# Patient Record
Sex: Male | Born: 1995 | Race: Black or African American | Hispanic: No | Marital: Single
Health system: Southern US, Community
[De-identification: ages and names within clinical notes are randomized; demographics above are authoritative.]

---
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
## 2019-07-03 ENCOUNTER — Other Ambulatory Visit: Payer: Self-pay

## 2019-07-03 DIAGNOSIS — Z20822 Contact with and (suspected) exposure to covid-19: Secondary | ICD-10-CM

## 2019-07-04 LAB — NOVEL CORONAVIRUS, NAA: SARS-CoV-2, NAA: DETECTED — AB

## 2019-07-05 DIAGNOSIS — U071 COVID-19: Secondary | ICD-10-CM | POA: Diagnosis not present

## 2019-11-30 ENCOUNTER — Ambulatory Visit: Payer: BC Managed Care – PPO | Attending: Internal Medicine

## 2019-11-30 DIAGNOSIS — Z23 Encounter for immunization: Secondary | ICD-10-CM

## 2019-11-30 NOTE — Progress Notes (Signed)
   Covid-19 Vaccination Clinic  Name:  Dez Stauffer    MRN: [id number] DOB: [birth year]  11/30/2019  Mr. Mowrey was observed post Covid-19 immunization for 15 minutes without incident. He was provided with Vaccine Information Sheet and instruction to access the V-Safe system.   Mr. Mogan was instructed to call 911 with any severe reactions post vaccine: Marland Kitchen Difficulty breathing  . Swelling of face and throat  . A fast heartbeat  . A bad rash all over body  . Dizziness and weakness   Immunizations Administered    Name Date Dose VIS Date Route   Pfizer COVID-19 Vaccine 11/30/2019  2:32 PM 0.3 mL 08/23/2019 Intramuscular   Manufacturer: [employer], [employer]   Lot: [id number]   NDC: [address]-2

## 2019-12-25 ENCOUNTER — Ambulatory Visit: Payer: BC Managed Care – PPO | Attending: Internal Medicine

## 2019-12-25 DIAGNOSIS — Z23 Encounter for immunization: Secondary | ICD-10-CM

## 2019-12-25 NOTE — Progress Notes (Signed)
   Covid-19 Vaccination Clinic  Name:  Colton Bryant    MRN: [id number] DOB: 1995-10-11  12/25/2019  Mr. [name] was observed post Covid-19 immunization for 15 minutes without incident. He was provided with Vaccine Information Sheet and instruction to access the V-Safe system.   Mr. [name] was instructed to call 911 with any severe reactions post vaccine: Marland Kitchen Difficulty breathing  . Swelling of face and throat  . A fast heartbeat  . A bad rash all over body  . Dizziness and weakness   Immunizations Administered    Name Date Dose VIS Date Route   Pfizer COVID-19 Vaccine 12/25/2019 11:46 AM 0.3 mL 08/23/2019 Intramuscular   Manufacturer: [employer], [employer]   Lot: [id number]   NDC: [address]-9

## 2020-06-23 DIAGNOSIS — Z6838 Body mass index (BMI) 38.0-38.9, adult: Secondary | ICD-10-CM | POA: Diagnosis not present

## 2020-06-23 DIAGNOSIS — Z1331 Encounter for screening for depression: Secondary | ICD-10-CM | POA: Diagnosis not present

## 2020-06-23 DIAGNOSIS — Z0000 Encounter for general adult medical examination without abnormal findings: Secondary | ICD-10-CM | POA: Diagnosis not present

## 2020-12-30 DIAGNOSIS — S76012A Strain of muscle, fascia and tendon of left hip, initial encounter: Secondary | ICD-10-CM | POA: Diagnosis not present

## 2020-12-30 DIAGNOSIS — E6609 Other obesity due to excess calories: Secondary | ICD-10-CM | POA: Diagnosis not present

## 2020-12-30 DIAGNOSIS — Z6836 Body mass index (BMI) 36.0-36.9, adult: Secondary | ICD-10-CM | POA: Diagnosis not present

## 2020-12-30 DIAGNOSIS — S76011A Strain of muscle, fascia and tendon of right hip, initial encounter: Secondary | ICD-10-CM | POA: Diagnosis not present

## 2020-12-30 DIAGNOSIS — Z1331 Encounter for screening for depression: Secondary | ICD-10-CM | POA: Diagnosis not present

## 2021-01-26 ENCOUNTER — Other Ambulatory Visit: Payer: Self-pay

## 2021-01-26 ENCOUNTER — Emergency Department: Payer: BC Managed Care – PPO

## 2021-01-26 ENCOUNTER — Emergency Department
Admission: EM | Admit: 2021-01-26 | Discharge: 2021-01-26 | Disposition: A | Discharge status: Home / Self Care | Payer: BC Managed Care – PPO | Attending: Emergency Medicine | Admitting: Emergency Medicine

## 2021-01-26 DIAGNOSIS — R200 Anesthesia of skin: Secondary | ICD-10-CM | POA: Insufficient documentation

## 2021-01-26 DIAGNOSIS — R079 Chest pain, unspecified: Secondary | ICD-10-CM

## 2021-01-26 DIAGNOSIS — I10 Essential (primary) hypertension: Secondary | ICD-10-CM | POA: Diagnosis not present

## 2021-01-26 LAB — COMPREHENSIVE METABOLIC PANEL
ALT: 21 U/L (ref 0–44)
AST: 25 U/L (ref 15–41)
Albumin: 4.3 g/dL (ref 3.5–5.0)
Alkaline Phosphatase: 40 U/L (ref 38–126)
Anion gap: 7 (ref 5–15)
BUN: 18 mg/dL (ref 6–20)
CO2: 24 mmol/L (ref 22–32)
Calcium: 9.1 mg/dL (ref 8.9–10.3)
Chloride: 105 mmol/L (ref 98–111)
Creatinine, Ser: 1 mg/dL (ref 0.61–1.24)
GFR, Estimated: >60 mL/min (ref >60)
Glucose, Bld: 95 mg/dL (ref 70–99)
Potassium: 4 mmol/L (ref 3.5–5.1)
Sodium: 136 mmol/L (ref 135–145)
Total Bilirubin: 0.5 mg/dL (ref 0.3–1.2)
Total Protein: 7.2 g/dL (ref 6.5–8.1)

## 2021-01-26 LAB — CBC WITH DIFFERENTIAL/PLATELET
Abs Immature Granulocytes: 0.01 10*3/uL (ref 0.00–0.07)
Basophils Absolute: 0.1 10*3/uL (ref 0.0–0.1)
Basophils Relative: 1 %
Eosinophils Absolute: 0.2 10*3/uL (ref 0.0–0.5)
Eosinophils Relative: 3 %
HCT: 43.2 % (ref 39.0–52.0)
Hemoglobin: 14.9 g/dL (ref 13.0–17.0)
Immature Granulocytes: 0 %
Lymphocytes Relative: 56 %
Lymphs Abs: 4 10*3/uL (ref 0.7–4.0)
MCH: 29.9 pg (ref 26.0–34.0)
MCHC: 34.5 g/dL (ref 30.0–36.0)
MCV: 86.6 fL (ref 80.0–100.0)
Monocytes Absolute: 0.5 10*3/uL (ref 0.1–1.0)
Monocytes Relative: 7 %
Neutro Abs: 2.3 10*3/uL (ref 1.7–7.7)
Neutrophils Relative %: 33 %
Platelets: 183 10*3/uL (ref 150–400)
RBC: 4.99 MIL/uL (ref 4.22–5.81)
RDW: 13.2 % (ref 11.5–15.5)
WBC: 7 10*3/uL (ref 4.0–10.5)
nRBC: 0 % (ref 0.0–0.2)

## 2021-01-26 LAB — TROPONIN I (HIGH SENSITIVITY)
Troponin I (High Sensitivity): 3 ng/L (ref <18)
Troponin I (High Sensitivity): 3 ng/L (ref <18)

## 2021-01-26 LAB — LIPASE, BLOOD: Lipase: 43 U/L (ref 11–51)

## 2021-01-26 MED ORDER — FAMOTIDINE IN NACL 20-0.9 MG/50ML-% IV SOLN
20.0000 mg | Freq: Once | INTRAVENOUS | Status: AC
  Administered 2021-01-26: 20 mg via INTRAVENOUS
  Filled 2021-01-26: qty 50

## 2021-01-26 MED ORDER — KETOROLAC TROMETHAMINE 30 MG/ML IJ SOLN
15.0000 mg | Freq: Once | INTRAMUSCULAR | Status: AC
  Administered 2021-01-26: 15 mg via INTRAVENOUS
  Filled 2021-01-26: qty 1

## 2021-01-26 NOTE — ED Triage Notes (Signed)
Pt in with co chest pain and right arm "swelling and numbness" that started today.

## 2021-01-26 NOTE — Discharge Instructions (Signed)
Record your blood pressure readings morning, noon and night.  Take these to your doctor when you follow-up.  Return to the ER for worsening symptoms, persistent vomiting, difficulty breathing or other concerns.

## 2021-01-26 NOTE — ED Provider Notes (Signed)
[facility] Emergency Department Provider Note   ____________________________________________   Event Date/Time   First MD Initiated Contact with Patient 01/26/21 [phone]     (approximate)  I have reviewed the triage vital signs and the nursing notes.   HISTORY  Chief Complaint Chest Pain and Numbness    HPI Colton Bryant is a 25 y.o. male who presents to the ED from home with a chief complaint of chest pain.  Patient reports chest tightness starting approximately 1 AM while trying to sleep.  Denies associated diaphoresis, shortness of breath, palpitations, nausea/vomiting or dizziness.  Denies right arm swelling to me; states feels a little numb (pointing to forearm).  Denies fever, neck pain, cough, abdominal pain, dysuria or diarrhea.  Denies recent long travel, trauma or hormone use.  Admits to recent marijuana use.     Past medical history None  There are no problems to display for this patient.   Prior to Admission medications   Not on File    Allergies Patient has no allergy information on record.  No family history on file.  Social History    Review of Systems  Constitutional: No fever/chills Eyes: No visual changes. ENT: No sore throat. Cardiovascular: Positive for chest pain. Respiratory: Denies shortness of breath. Gastrointestinal: No abdominal pain.  No nausea, no vomiting.  No diarrhea.  No constipation. Genitourinary: Negative for dysuria. Musculoskeletal: Negative for back pain. Skin: Negative for rash. Neurological: Negative for headaches, focal weakness or numbness.   ____________________________________________   PHYSICAL EXAM:  VITAL SIGNS: ED Triage Vitals  Enc Vitals Group     BP 01/26/21 0226 (!) 165/107     Pulse Rate 01/26/21 0226 68     Resp 01/26/21 0226 20     Temp 01/26/21 0226 97.9 F (36.6 C)     Temp Source 01/26/21 0226 Oral     SpO2 01/26/21 0226 100 %     Weight 01/26/21 0225 246 lb  (111.6 kg)     Height 01/26/21 0225 5\' 7"  (1.702 m)     Head Circumference --      Peak Flow --      Pain Score 01/26/21 0225 2     Pain Loc --      Pain Edu? --      Excl. in [place]? --     Constitutional: Alert and oriented. Well appearing and in no acute distress. Eyes: Conjunctivae are normal. PERRL. EOMI. Head: Atraumatic. Nose: No congestion/rhinnorhea. Mouth/Throat: Mucous membranes are moist.   Neck: No stridor.  No cervical spine tenderness to palpation.  No thyromegaly. Cardiovascular: Normal rate, regular rhythm. Grossly normal heart sounds.  Good peripheral circulation. Respiratory: Normal respiratory effort.  No retractions. Lungs CTAB. Gastrointestinal: Soft and nontender to light or deep palpation. No distention. No abdominal bruits. No CVA tenderness. Musculoskeletal: No lower extremity tenderness nor edema.  No joint effusions. Neurologic:  Normal speech and language. No gross focal neurologic deficits are appreciated.  5/5 motor strength and sensation all extremities.  No gait instability. Skin:  Skin is warm, dry and intact. No rash noted. Psychiatric: Mood and affect are mildly anxious. Speech and behavior are normal.  ____________________________________________   LABS (all labs ordered are listed, but only abnormal results are displayed)  Labs Reviewed  CBC WITH DIFFERENTIAL/PLATELET  COMPREHENSIVE METABOLIC PANEL  LIPASE, BLOOD  TROPONIN I (HIGH SENSITIVITY)  TROPONIN I (HIGH SENSITIVITY)   ____________________________________________  EKG  ED ECG REPORT I, Layani Foronda J, the attending physician, personally  viewed and interpreted this ECG.   Date: 01/26/2021  EKG Time: 0231  Rate: 65  Rhythm: normal EKG, normal sinus rhythm  Axis: Normal  Intervals:none  ST&T Change: Nonspecific  ____________________________________________  RADIOLOGY I, Felisia Balcom J, personally viewed and evaluated these images (plain radiographs) as part of my medical decision  making, as well as reviewing the written report by the radiologist.  ED MD interpretation: No acute cardiopulmonary process  Official radiology report(s): DG Chest 2 View  Result Date: 01/26/2021 CLINICAL DATA:  Chest pain EXAM: CHEST - 2 VIEW COMPARISON:  None. FINDINGS: The heart size and mediastinal contours are within normal limits. Both lungs are clear. The visualized skeletal structures are unremarkable. IMPRESSION: No active cardiopulmonary disease. Electronically Signed   By: Deatra Robinson M.D.   On: 01/26/2021 03:33    ____________________________________________   PROCEDURES  Procedure(s) performed (including Critical Care):  .1-3 Lead EKG Interpretation Performed by: Irean Hong, MD Authorized by: Irean Hong, MD     Interpretation: normal     ECG rate:  68   ECG rate assessment: normal     Rhythm: sinus rhythm     Ectopy: none     Conduction: normal   Comments:     Patient placed on cardiac monitor to evaluate for arrhythmias     ____________________________________________   INITIAL IMPRESSION / ASSESSMENT AND PLAN / ED COURSE  As part of my medical decision making, I reviewed the following data within the electronic MEDICAL RECORD NUMBER Nursing notes reviewed and incorporated, Labs reviewed, EKG interpreted, Old chart reviewed, Radiograph reviewed and Notes from prior ED visits     25 year old male presenting with chest tightness. Differential diagnosis includes, but is not limited to, ACS, aortic dissection, pulmonary embolism, cardiac tamponade, pneumothorax, pneumonia, pericarditis, myocarditis, GI-related causes including esophagitis/gastritis, and musculoskeletal chest wall pain.    Will obtain cardiac panel, chest x-ray.  Patient reported upper abdominal "hunger pains" prior to arrival, will add LFTs/lipase.  Administer IV Pepcid and Toradol.  Will reassess.  Clinical Course as of 01/26/21 3419  Tue Jan 26, 2021  3790 Feeling better.  IV Pepcid  infusing.  Updated patient and father of all test results thus far; will repeat troponin.  Although patient is mildly hypertensive, I have low suspicion for aortic dissection; chest pain has completely resolved. [JS]  [id number] Updated patient and father of repeat negative troponin.  Patient voices no complaints of chest pain or right arm numbness.  Advised him to keep a log of his blood pressures and take him to his doctor.  Strict return precautions given.  Patient verbalizes understanding agrees with plan of care. [JS]    Clinical Course User Index [JS] Irean Hong, MD     ____________________________________________   FINAL CLINICAL IMPRESSION(S) / ED DIAGNOSES  Final diagnoses:  Chest pain, unspecified type  Hypertension, unspecified type     ED Discharge Orders    None      *Please note:  Betty Brooks was evaluated in Emergency Department on 01/26/2021 for the symptoms described in the history of present illness. He was evaluated in the context of the global COVID-19 pandemic, which necessitated consideration that the patient might be at risk for infection with the SARS-CoV-2 virus that causes COVID-19. Institutional protocols and algorithms that pertain to the evaluation of patients at risk for COVID-19 are in a state of rapid change based on information released by regulatory bodies including the CDC and federal and state organizations. These policies and  algorithms were followed during the patient's care in the ED.  Some ED evaluations and interventions may be delayed as a result of limited staffing during and the pandemic.*   Note:  This document was prepared using Dragon voice recognition software and may include unintentional dictation errors.   Irean Hong, MD 01/26/21 [phone]

## 2021-02-08 ENCOUNTER — Emergency Department
Admission: EM | Admit: 2021-02-08 | Discharge: 2021-02-08 | Disposition: A | Discharge status: Home / Self Care | Payer: BC Managed Care – PPO | Attending: Emergency Medicine | Admitting: Emergency Medicine

## 2021-02-08 ENCOUNTER — Encounter: Payer: Self-pay | Admitting: Emergency Medicine

## 2021-02-08 DIAGNOSIS — R112 Nausea with vomiting, unspecified: Secondary | ICD-10-CM

## 2021-02-08 DIAGNOSIS — R55 Syncope and collapse: Secondary | ICD-10-CM | POA: Diagnosis not present

## 2021-02-08 LAB — CBC WITH DIFFERENTIAL/PLATELET
Abs Immature Granulocytes: 0.01 10*3/uL (ref 0.00–0.07)
Basophils Absolute: 0 10*3/uL (ref 0.0–0.1)
Basophils Relative: 1 %
Eosinophils Absolute: 0 10*3/uL (ref 0.0–0.5)
Eosinophils Relative: 1 %
HCT: 42.1 % (ref 39.0–52.0)
Hemoglobin: 14.3 g/dL (ref 13.0–17.0)
Immature Granulocytes: 0 %
Lymphocytes Relative: 40 %
Lymphs Abs: 2 10*3/uL (ref 0.7–4.0)
MCH: 29.4 pg (ref 26.0–34.0)
MCHC: 34 g/dL (ref 30.0–36.0)
MCV: 86.4 fL (ref 80.0–100.0)
Monocytes Absolute: 0.3 10*3/uL (ref 0.1–1.0)
Monocytes Relative: 6 %
Neutro Abs: 2.6 10*3/uL (ref 1.7–7.7)
Neutrophils Relative %: 52 %
Platelets: 197 10*3/uL (ref 150–400)
RBC: 4.87 MIL/uL (ref 4.22–5.81)
RDW: 13 % (ref 11.5–15.5)
WBC: 4.9 10*3/uL (ref 4.0–10.5)
nRBC: 0 % (ref 0.0–0.2)

## 2021-02-08 LAB — BASIC METABOLIC PANEL
Anion gap: 10 (ref 5–15)
BUN: 10 mg/dL (ref 6–20)
CO2: 22 mmol/L (ref 22–32)
Calcium: 9.6 mg/dL (ref 8.9–10.3)
Chloride: 105 mmol/L (ref 98–111)
Creatinine, Ser: 1.08 mg/dL (ref 0.61–1.24)
GFR, Estimated: >60 mL/min (ref >60)
Glucose, Bld: 99 mg/dL (ref 70–99)
Potassium: 3.7 mmol/L (ref 3.5–5.1)
Sodium: 137 mmol/L (ref 135–145)

## 2021-02-08 LAB — TROPONIN I (HIGH SENSITIVITY): Troponin I (High Sensitivity): 3 ng/L (ref <18)

## 2021-02-08 MED ORDER — SODIUM CHLORIDE 0.9 % IV BOLUS
1000.0000 mL | Freq: Once | INTRAVENOUS | Status: AC
  Administered 2021-02-08: 1000 mL via INTRAVENOUS

## 2021-02-08 NOTE — ED Triage Notes (Signed)
Pt BIB EMS for syncope. PT was a work and had sudden onset of thirst and nausea. Pt threw up and walked to bathroom and passed out.  Pt previously on steroid and muscle relaxer.  Pt highly agitated on scene. Cooperative in triage.

## 2021-02-08 NOTE — ED Notes (Signed)
Pt has been provided with discharge instructions. Pt denies any questions or concerns at this time. Pt verbalizes understanding for follow up care and d/c.  VSS.  Pt left department with all belongings.  

## 2021-02-08 NOTE — ED Provider Notes (Signed)
[facility] Emergency Department Provider Note   ____________________________________________   Event Date/Time   First MD Initiated Contact with Patient 02/08/21 1756     (approximate)  I have reviewed the triage vital signs and the nursing notes.   HISTORY  Chief Complaint Loss of Consciousness    HPI Colton Bryant is a 25 y.o. male with no significant past medical history who presents to the ED for syncope.  Patient states that he was working the Ambulance person at [employer] when he suddenly started to feel nauseous.  He went to the bathroom and vomited multiple times, attempted to make it out of the bathroom and then collapsed to the ground.  He states he does not remember what happened until bystanders were stopping him from getting up off the ground.  There was no reported seizure-like activity.  Patient was reportedly agitated on the scene but is now calm and cooperative.  He denies any associated chest pain or shortness of breath.  He has not had any fevers, cough, abdominal pain, or diarrhea.  He does report passing out once before, admits he had not had much to eat or drink today due to being at work.        History reviewed. No pertinent past medical history.  There are no problems to display for this patient.   History reviewed. No pertinent surgical history.  Prior to Admission medications   Not on File    Allergies Patient has no allergy information on record.  No family history on file.  Social History    Review of Systems  Constitutional: No fever/chills Eyes: No visual changes. ENT: No sore throat. Cardiovascular: Denies chest pain.  Positive for lightheadedness and syncope. Respiratory: Denies shortness of breath. Gastrointestinal: No abdominal pain.  Positive for nausea and vomiting.  No diarrhea.  No constipation. Genitourinary: Negative for dysuria. Musculoskeletal: Negative for back pain. Skin: Negative for  rash. Neurological: Negative for headaches, focal weakness or numbness.  ____________________________________________   PHYSICAL EXAM:  VITAL SIGNS: ED Triage Vitals  Enc Vitals Group     BP --      Pulse --      Resp --      Temp --      Temp src --      SpO2 02/08/21 1754 98 %     Weight 02/08/21 1755 235 lb (106.6 kg)     Height 02/08/21 1755 5\' 7"  (1.702 m)     Head Circumference --      Peak Flow --      Pain Score 02/08/21 1756 0     Pain Loc --      Pain Edu? --      Excl. in [place]? --     Constitutional: Alert and oriented. Eyes: Conjunctivae are normal. Head: Atraumatic. Nose: No congestion/rhinnorhea. Mouth/Throat: Mucous membranes are moist. Neck: Normal ROM Cardiovascular: Normal rate, regular rhythm. Grossly normal heart sounds.  2+ radial pulses bilaterally. Respiratory: Normal respiratory effort.  No retractions. Lungs CTAB. Gastrointestinal: Soft and nontender. No distention. Genitourinary: deferred Musculoskeletal: No lower extremity tenderness nor edema. Neurologic:  Normal speech and language. No gross focal neurologic deficits are appreciated. Skin:  Skin is warm, dry and intact. No rash noted. Psychiatric: Mood and affect are normal. Speech and behavior are normal.  ____________________________________________   LABS (all labs ordered are listed, but only abnormal results are displayed)  Labs Reviewed  CBC WITH DIFFERENTIAL/PLATELET  BASIC METABOLIC PANEL  TROPONIN I (  HIGH SENSITIVITY)   ____________________________________________  EKG  ED ECG REPORT I, Chesley Noon, the attending physician, personally viewed and interpreted this ECG.   Date: 02/08/2021  EKG Time: 17:57  Rate: 72  Rhythm: normal sinus rhythm  Axis: Normal  Intervals:none  ST&T Change: None   PROCEDURES  Procedure(s) performed (including Critical Care):  Procedures   ____________________________________________   INITIAL IMPRESSION / ASSESSMENT AND PLAN  / ED COURSE       25 year old male with no significant past medical history presents to the ED with episode of nausea and vomiting and subsequent syncope while at work.  He is no longer nauseous and did not have any chest pain or shortness of breath with this episode.  EKG shows no evidence of arrhythmia or ischemia, we will observe on cardiac monitor and screening labs.  There is no report of seizure-like activity and patient does not appear postictal.  Suspect his syncope was vasovagal or due to dehydration.  Lab work is unremarkable, troponin negative.  No events noted on cardiac monitor.  Patient feels better following IV fluids and is appropriate for discharge home with PCP follow-up.  He was counseled to return to the ED for new worsening symptoms, patient agrees with plan.      ____________________________________________   FINAL CLINICAL IMPRESSION(S) / ED DIAGNOSES  Final diagnoses:  Syncope and collapse  Non-intractable vomiting with nausea, unspecified vomiting type     ED Discharge Orders    None       Note:  This document was prepared using Dragon voice recognition software and may include unintentional dictation errors.   Chesley Noon, MD 02/08/21 Jerene Bears

## 2021-02-09 DIAGNOSIS — M545 Low back pain, unspecified: Secondary | ICD-10-CM | POA: Diagnosis not present

## 2021-02-11 DIAGNOSIS — M79606 Pain in leg, unspecified: Secondary | ICD-10-CM | POA: Diagnosis not present

## 2021-02-11 DIAGNOSIS — R55 Syncope and collapse: Secondary | ICD-10-CM | POA: Diagnosis not present

## 2021-02-11 DIAGNOSIS — Z6836 Body mass index (BMI) 36.0-36.9, adult: Secondary | ICD-10-CM | POA: Diagnosis not present

## 2021-02-11 DIAGNOSIS — E6609 Other obesity due to excess calories: Secondary | ICD-10-CM | POA: Diagnosis not present

## 2021-02-26 DIAGNOSIS — M545 Low back pain, unspecified: Secondary | ICD-10-CM | POA: Diagnosis not present

## 2021-03-05 DIAGNOSIS — M545 Low back pain, unspecified: Secondary | ICD-10-CM | POA: Diagnosis not present

## 2021-03-17 DIAGNOSIS — M545 Low back pain, unspecified: Secondary | ICD-10-CM | POA: Diagnosis not present

## 2021-03-31 DIAGNOSIS — M545 Low back pain, unspecified: Secondary | ICD-10-CM | POA: Diagnosis not present

## 2021-04-07 DIAGNOSIS — M545 Low back pain, unspecified: Secondary | ICD-10-CM | POA: Diagnosis not present

## 2021-04-19 DIAGNOSIS — M545 Low back pain, unspecified: Secondary | ICD-10-CM | POA: Diagnosis not present

## 2021-04-22 DIAGNOSIS — M545 Low back pain, unspecified: Secondary | ICD-10-CM | POA: Diagnosis not present

## 2022-02-16 DIAGNOSIS — Z6839 Body mass index (BMI) 39.0-39.9, adult: Secondary | ICD-10-CM | POA: Diagnosis not present

## 2022-02-16 DIAGNOSIS — Z0001 Encounter for general adult medical examination with abnormal findings: Secondary | ICD-10-CM | POA: Diagnosis not present

## 2022-02-16 DIAGNOSIS — Z1331 Encounter for screening for depression: Secondary | ICD-10-CM | POA: Diagnosis not present

## 2022-02-16 DIAGNOSIS — E6609 Other obesity due to excess calories: Secondary | ICD-10-CM | POA: Diagnosis not present

## 2022-11-13 ENCOUNTER — Emergency Department
Admission: EM | Admit: 2022-11-13 | Discharge: 2022-11-13 | Disposition: A | Discharge status: Home / Self Care | Payer: BC Managed Care – PPO | Attending: Emergency Medicine | Admitting: Emergency Medicine

## 2022-11-13 ENCOUNTER — Encounter: Payer: Self-pay | Admitting: Emergency Medicine

## 2022-11-13 ENCOUNTER — Emergency Department: Payer: BC Managed Care – PPO

## 2022-11-13 ENCOUNTER — Other Ambulatory Visit: Payer: Self-pay

## 2022-11-13 DIAGNOSIS — M79605 Pain in left leg: Secondary | ICD-10-CM | POA: Diagnosis not present

## 2022-11-13 DIAGNOSIS — W240XXA Contact with lifting devices, not elsewhere classified, initial encounter: Secondary | ICD-10-CM | POA: Insufficient documentation

## 2022-11-13 DIAGNOSIS — S8002XA Contusion of left knee, initial encounter: Secondary | ICD-10-CM | POA: Diagnosis not present

## 2022-11-13 DIAGNOSIS — M25562 Pain in left knee: Secondary | ICD-10-CM | POA: Diagnosis not present

## 2022-11-13 DIAGNOSIS — I10 Essential (primary) hypertension: Secondary | ICD-10-CM | POA: Diagnosis not present

## 2022-11-13 DIAGNOSIS — S39012A Strain of muscle, fascia and tendon of lower back, initial encounter: Secondary | ICD-10-CM

## 2022-11-13 DIAGNOSIS — S8992XA Unspecified injury of left lower leg, initial encounter: Secondary | ICD-10-CM | POA: Diagnosis not present

## 2022-11-13 MED ORDER — KETOROLAC TROMETHAMINE 30 MG/ML IJ SOLN
30.0000 mg | Freq: Once | INTRAMUSCULAR | Status: AC
  Administered 2022-11-13: 30 mg via INTRAMUSCULAR
  Filled 2022-11-13: qty 1

## 2022-11-13 MED ORDER — METHOCARBAMOL 500 MG PO TABS: 500.0000 mg | ORAL_TABLET | Freq: Four times a day (QID) | ORAL | 0 refills | Status: AC

## 2022-11-13 MED ORDER — MELOXICAM 15 MG PO TABS: 15.0000 mg | ORAL_TABLET | Freq: Every day | ORAL | 0 refills | Status: AC

## 2022-11-13 MED ORDER — METHOCARBAMOL 500 MG PO TABS
1000.0000 mg | ORAL_TABLET | Freq: Once | ORAL | Status: AC
  Administered 2022-11-13: 1000 mg via ORAL
  Filled 2022-11-13: qty 2

## 2022-11-13 NOTE — ED Triage Notes (Signed)
Pt via [facility] from home. 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 Pt states he has been able to bear weight but the pain has gotten worse over the past couple of days.

## 2022-11-13 NOTE — ED Provider Notes (Signed)
[facility] Provider Note  Patient Contact: 8:32 PM (approximate)   History   Knee Pain   HPI  Colton Bryant is a 27 y.o. male who presents the emergency department complaining of pain to the knee and back.  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  Patient states that the pain in his knee caused him to walk funny, making his back irritated as well.  Patient has a history of back problems, has been seen by orthopedics for same.  No concerning neurodeficits.  Patient denies any open wounds.  Injury occurred 3 days ago.     Physical Exam   Triage Vital Signs: ED Triage Vitals  Enc Vitals Group     BP 11/13/22 1815 (!) 149/70     Pulse Rate 11/13/22 1815 70     Resp 11/13/22 1815 18     Temp 11/13/22 1815 97.6 F (36.4 C)     Temp Source 11/13/22 1815 Oral     SpO2 11/13/22 1815 100 %     Weight 11/13/22 1814 250 lb (113.4 kg)     Height 11/13/22 1814 '5\' 7"'$  (1.702 m)     Head Circumference --      Peak Flow --      Pain Score --      Pain Loc --      Pain Edu? --      Excl. in [place]? --     Most recent vital signs: Vitals:   11/13/22 1815  BP: (!) 149/70  Pulse: 70  Resp: 18  Temp: 97.6 F (36.4 C)  SpO2: 100%     General: Alert and in no acute distress.  Cardiovascular:  Good peripheral perfusion Respiratory: Normal respiratory effort without tachypnea or retractions. Lungs CTAB.  Musculoskeletal: Full range of motion to all extremities.  Sensation on the left knee reveals no obvious signs of trauma.  Patient is largely nontender palpation over the osseous structures.  Special tests of the knee are negative.  Palpation of the spine reveals no midline tenderness.  No palpable abnormality or step-off.  Tenderness along left paraspinal muscle group.  No SI joint tenderness or sciatic notch tenderness.  Pulse and sensation intact and equal bilateral lower  extremities. Neurologic:  No gross focal neurologic deficits are appreciated.  Skin:   No rash noted Other:   ED Results / Procedures / Treatments   Labs (all labs ordered are listed, but only abnormal results are displayed) Labs Reviewed - No data to display   EKG     RADIOLOGY  I personally viewed, evaluated, and interpreted these images as part of my medical decision making, as well as reviewing the written report by the radiologist.  ED Provider Interpretation: No acute traumatic findings to x-ray of the knee  DG Knee Complete 4 Views Left  Result Date: 11/13/2022 CLINICAL DATA:  Knee pain after trauma EXAM: LEFT KNEE - COMPLETE 4 VIEW COMPARISON:  None Available. FINDINGS: No evidence of fracture, dislocation, or joint effusion. No evidence of arthropathy or other focal bone abnormality. Soft tissues are unremarkable. IMPRESSION: No acute osseous abnormality Electronically Signed   By: Jill Side M.D.   On: 11/13/2022 18:49    PROCEDURES:  Critical Care performed: No  Procedures   MEDICATIONS ORDERED IN ED: Medications  ketorolac (TORADOL) 30 MG/ML injection 30 mg (has no administration in time range)  methocarbamol (ROBAXIN) tablet 1,000  mg (has no administration in time range)     IMPRESSION / MDM / ASSESSMENT AND PLAN / ED COURSE  I reviewed the triage vital signs and the nursing notes.                                 Differential diagnosis includes, but is not limited to, knee contusion, patellar fracture, knee effusion, lumbar strain   Patient's presentation is most consistent with acute presentation with potential threat to life or bodily function.   Patient's diagnosis is consistent with knee contusion, lumbago.  Patient presents the emergency department after hitting his knee on a piece of machinery at work.  X-ray is reassuring with no acute traumatic findings.  Exam is consistent with contusion.  Patient has some chronic back issues, has been  altering his gait due to the knee problem as pain along the left back.  This consistent with a lumbar strain.  Patient will have anti-inflammatory muscle relaxer.  Follow-up with primary care or orthopedics as needed.  Return precautions discussed with the patient..  Patient is given ED precautions to return to the ED for any worsening or new symptoms.     FINAL CLINICAL IMPRESSION(S) / ED DIAGNOSES   Final diagnoses:  Contusion of left knee, initial encounter  Strain of lumbar region, initial encounter     Rx / DC Orders   ED Discharge Orders          Ordered    meloxicam (MOBIC) 15 MG tablet  Daily        11/13/22 2045    methocarbamol (ROBAXIN) 500 MG tablet  4 times daily        11/13/22 2045             Note:  This document was prepared using Dragon voice recognition software and may include unintentional dictation errors.   Brynda Peon 11/13/22 2045    Lucillie Garfinkel, MD 11/14/22 2308

## 2022-11-13 NOTE — ED Notes (Signed)
Rn to bedside. Pt is CAOx4 and in no acute distress.

## 2022-12-16 DIAGNOSIS — M545 Low back pain, unspecified: Secondary | ICD-10-CM | POA: Diagnosis not present

## 2022-12-16 DIAGNOSIS — Z6838 Body mass index (BMI) 38.0-38.9, adult: Secondary | ICD-10-CM | POA: Diagnosis not present

## 2022-12-16 DIAGNOSIS — E6609 Other obesity due to excess calories: Secondary | ICD-10-CM | POA: Diagnosis not present

## 2023-01-04 DIAGNOSIS — M9906 Segmental and somatic dysfunction of lower extremity: Secondary | ICD-10-CM | POA: Diagnosis not present

## 2023-01-04 DIAGNOSIS — M9901 Segmental and somatic dysfunction of cervical region: Secondary | ICD-10-CM | POA: Diagnosis not present

## 2023-01-04 DIAGNOSIS — M9903 Segmental and somatic dysfunction of lumbar region: Secondary | ICD-10-CM | POA: Diagnosis not present

## 2023-01-04 DIAGNOSIS — M9902 Segmental and somatic dysfunction of thoracic region: Secondary | ICD-10-CM | POA: Diagnosis not present

## 2023-01-05 DIAGNOSIS — M9903 Segmental and somatic dysfunction of lumbar region: Secondary | ICD-10-CM | POA: Diagnosis not present

## 2023-01-05 DIAGNOSIS — M9902 Segmental and somatic dysfunction of thoracic region: Secondary | ICD-10-CM | POA: Diagnosis not present

## 2023-01-05 DIAGNOSIS — M9906 Segmental and somatic dysfunction of lower extremity: Secondary | ICD-10-CM | POA: Diagnosis not present

## 2023-01-05 DIAGNOSIS — M9901 Segmental and somatic dysfunction of cervical region: Secondary | ICD-10-CM | POA: Diagnosis not present

## 2023-01-09 DIAGNOSIS — M9901 Segmental and somatic dysfunction of cervical region: Secondary | ICD-10-CM | POA: Diagnosis not present

## 2023-01-09 DIAGNOSIS — M9906 Segmental and somatic dysfunction of lower extremity: Secondary | ICD-10-CM | POA: Diagnosis not present

## 2023-01-09 DIAGNOSIS — M9902 Segmental and somatic dysfunction of thoracic region: Secondary | ICD-10-CM | POA: Diagnosis not present

## 2023-01-09 DIAGNOSIS — M9903 Segmental and somatic dysfunction of lumbar region: Secondary | ICD-10-CM | POA: Diagnosis not present

## 2023-01-11 DIAGNOSIS — M9903 Segmental and somatic dysfunction of lumbar region: Secondary | ICD-10-CM | POA: Diagnosis not present

## 2023-01-11 DIAGNOSIS — M9901 Segmental and somatic dysfunction of cervical region: Secondary | ICD-10-CM | POA: Diagnosis not present

## 2023-01-11 DIAGNOSIS — M9902 Segmental and somatic dysfunction of thoracic region: Secondary | ICD-10-CM | POA: Diagnosis not present

## 2023-01-11 DIAGNOSIS — M9906 Segmental and somatic dysfunction of lower extremity: Secondary | ICD-10-CM | POA: Diagnosis not present

## 2023-01-12 DIAGNOSIS — M9902 Segmental and somatic dysfunction of thoracic region: Secondary | ICD-10-CM | POA: Diagnosis not present

## 2023-01-12 DIAGNOSIS — M9903 Segmental and somatic dysfunction of lumbar region: Secondary | ICD-10-CM | POA: Diagnosis not present

## 2023-01-12 DIAGNOSIS — M9901 Segmental and somatic dysfunction of cervical region: Secondary | ICD-10-CM | POA: Diagnosis not present

## 2023-01-12 DIAGNOSIS — M9906 Segmental and somatic dysfunction of lower extremity: Secondary | ICD-10-CM | POA: Diagnosis not present

## 2023-01-17 DIAGNOSIS — M9902 Segmental and somatic dysfunction of thoracic region: Secondary | ICD-10-CM | POA: Diagnosis not present

## 2023-01-17 DIAGNOSIS — M9906 Segmental and somatic dysfunction of lower extremity: Secondary | ICD-10-CM | POA: Diagnosis not present

## 2023-01-17 DIAGNOSIS — M9903 Segmental and somatic dysfunction of lumbar region: Secondary | ICD-10-CM | POA: Diagnosis not present

## 2023-01-17 DIAGNOSIS — M9901 Segmental and somatic dysfunction of cervical region: Secondary | ICD-10-CM | POA: Diagnosis not present

## 2023-01-17 IMAGING — CR DG CHEST 2V
1 series · 2 of 2 positions shown · non-contrast
Comparison: None.

CLINICAL DATA: Chest pain

EXAM:
CHEST - 2 VIEW

[Series 1: dg chest 2 view · 0.14mm/px · 2 of 2 slices shown]
[im 1/2]
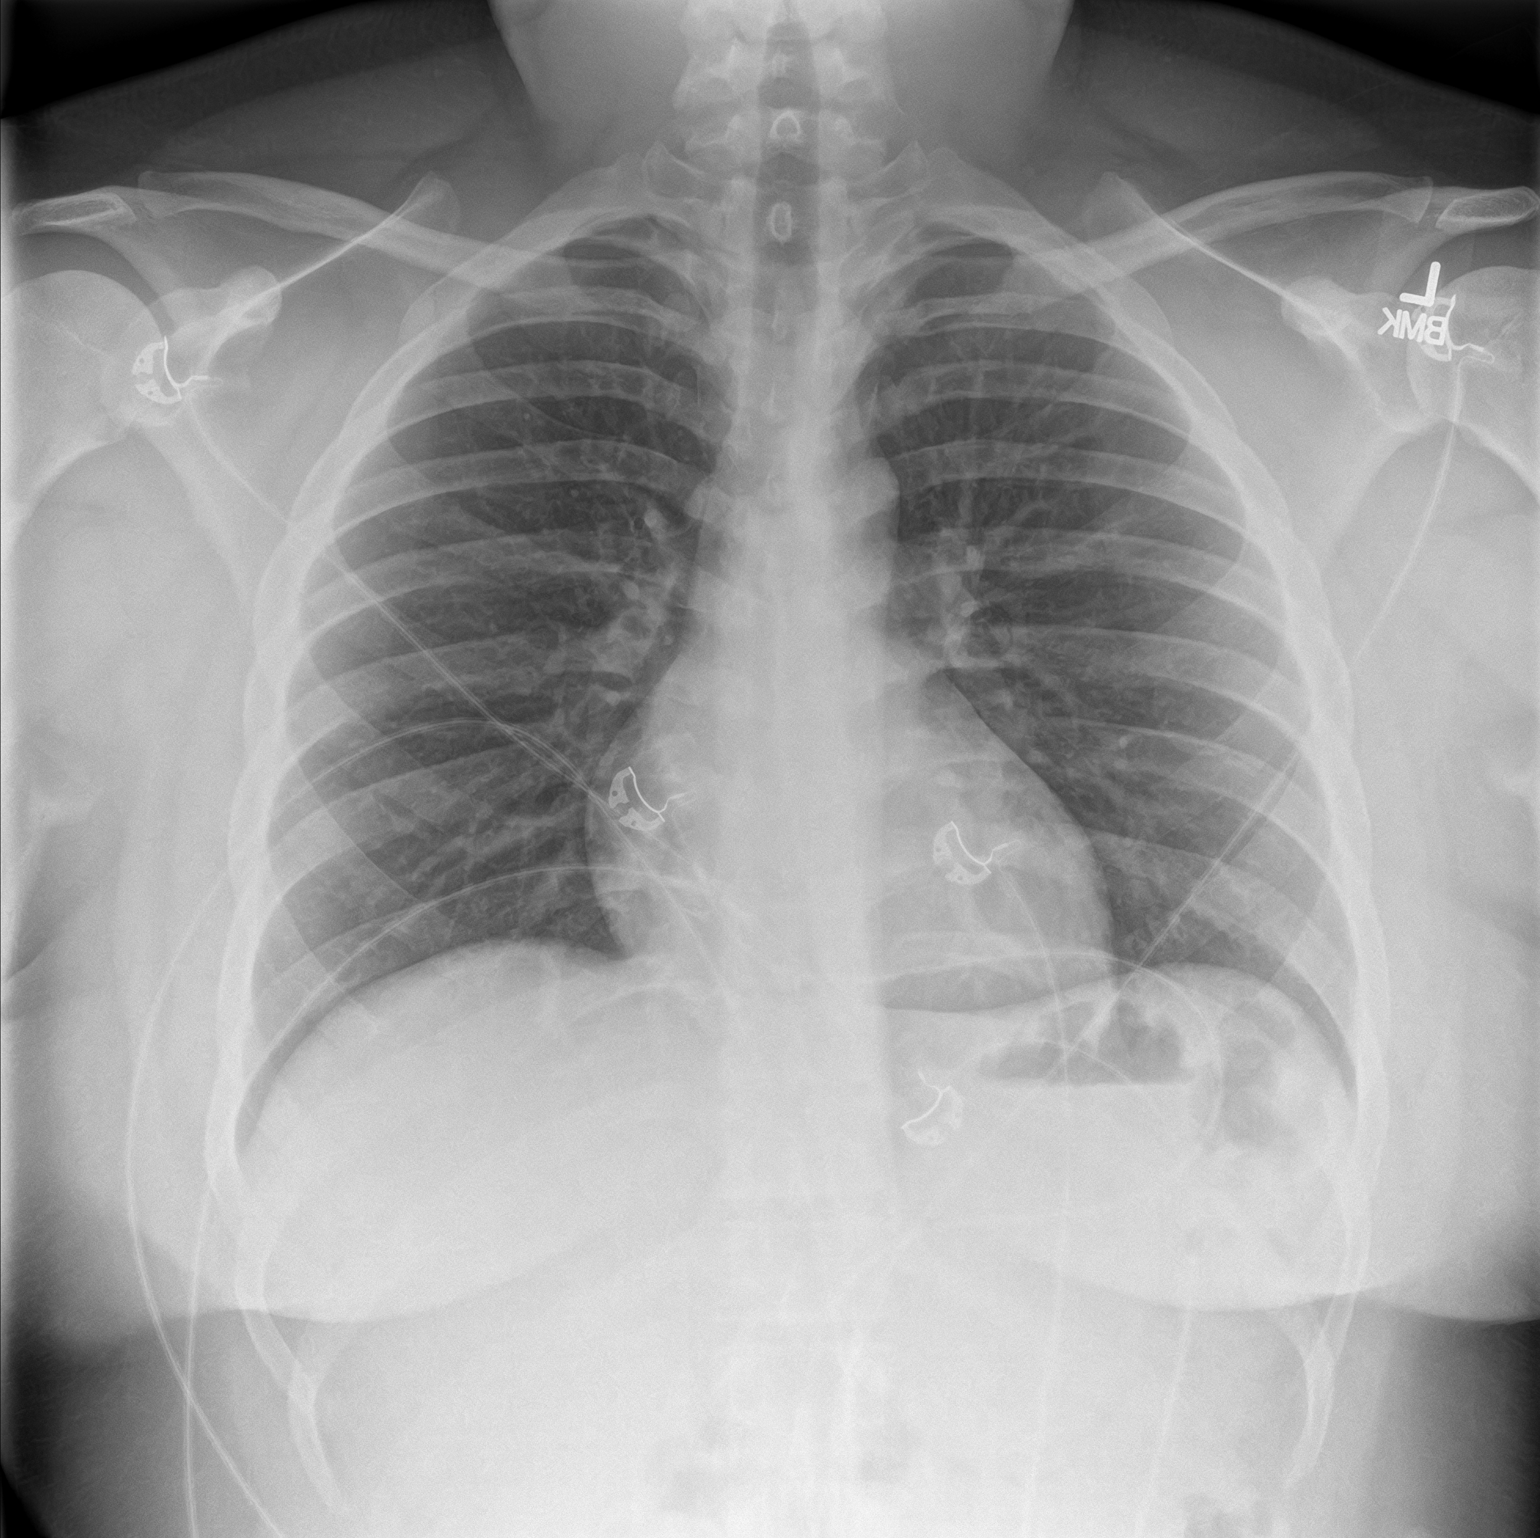
[im 2/2]
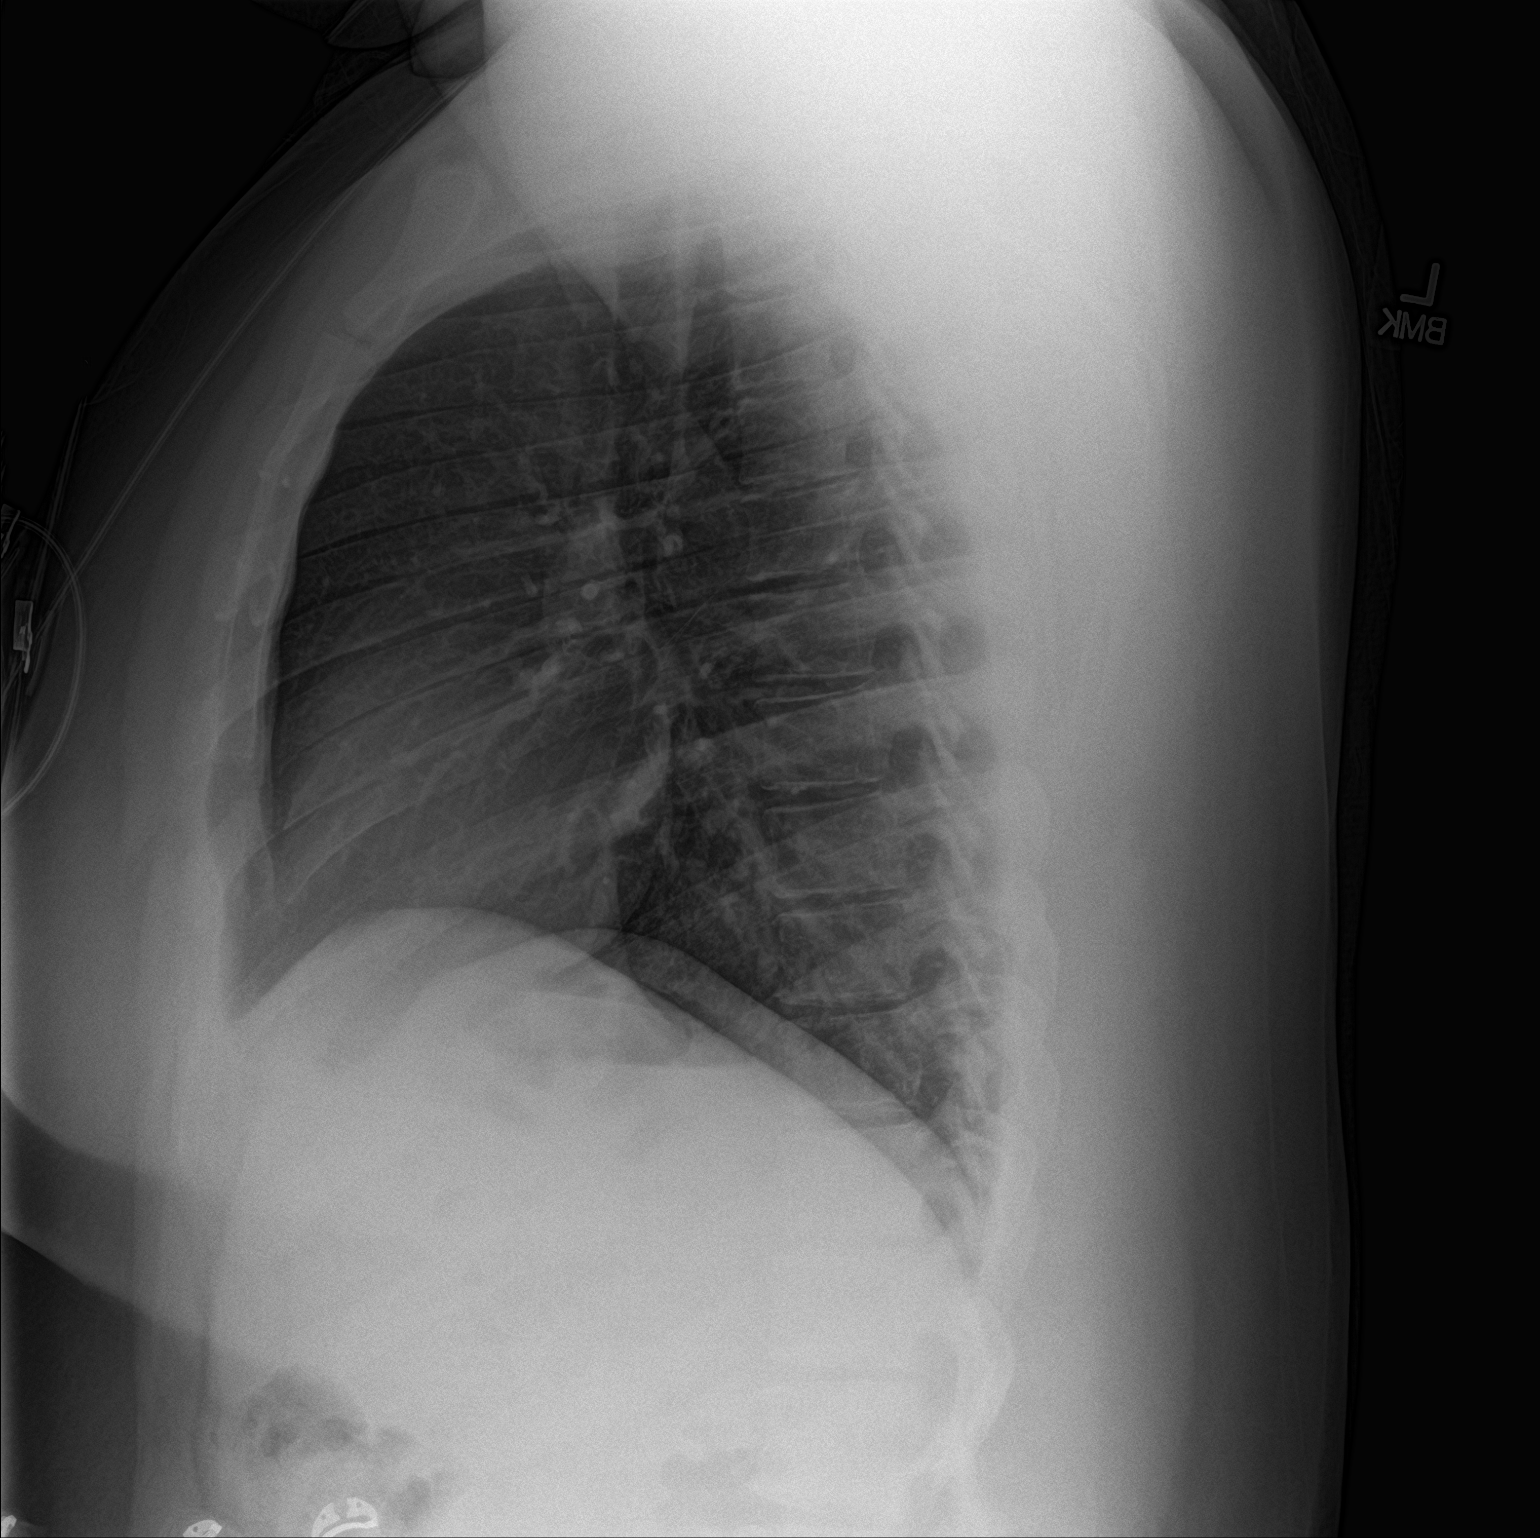

[2 of 2 positions shown; findings below may reference images not displayed]

FINDINGS: The heart size and mediastinal contours are within normal limits.
Both lungs are clear. The visualized skeletal structures are
unremarkable.
IMPRESSION: No active cardiopulmonary disease.

## 2023-01-18 DIAGNOSIS — M9903 Segmental and somatic dysfunction of lumbar region: Secondary | ICD-10-CM | POA: Diagnosis not present

## 2023-01-18 DIAGNOSIS — M9906 Segmental and somatic dysfunction of lower extremity: Secondary | ICD-10-CM | POA: Diagnosis not present

## 2023-01-18 DIAGNOSIS — M9901 Segmental and somatic dysfunction of cervical region: Secondary | ICD-10-CM | POA: Diagnosis not present

## 2023-01-18 DIAGNOSIS — M9902 Segmental and somatic dysfunction of thoracic region: Secondary | ICD-10-CM | POA: Diagnosis not present

## 2023-01-23 DIAGNOSIS — M9906 Segmental and somatic dysfunction of lower extremity: Secondary | ICD-10-CM | POA: Diagnosis not present

## 2023-01-23 DIAGNOSIS — M9902 Segmental and somatic dysfunction of thoracic region: Secondary | ICD-10-CM | POA: Diagnosis not present

## 2023-01-23 DIAGNOSIS — M9903 Segmental and somatic dysfunction of lumbar region: Secondary | ICD-10-CM | POA: Diagnosis not present

## 2023-01-23 DIAGNOSIS — M9901 Segmental and somatic dysfunction of cervical region: Secondary | ICD-10-CM | POA: Diagnosis not present

## 2023-01-24 DIAGNOSIS — M9906 Segmental and somatic dysfunction of lower extremity: Secondary | ICD-10-CM | POA: Diagnosis not present

## 2023-01-24 DIAGNOSIS — M9902 Segmental and somatic dysfunction of thoracic region: Secondary | ICD-10-CM | POA: Diagnosis not present

## 2023-01-24 DIAGNOSIS — M9903 Segmental and somatic dysfunction of lumbar region: Secondary | ICD-10-CM | POA: Diagnosis not present

## 2023-01-24 DIAGNOSIS — M9901 Segmental and somatic dysfunction of cervical region: Secondary | ICD-10-CM | POA: Diagnosis not present

## 2023-01-25 DIAGNOSIS — M9906 Segmental and somatic dysfunction of lower extremity: Secondary | ICD-10-CM | POA: Diagnosis not present

## 2023-01-25 DIAGNOSIS — M9902 Segmental and somatic dysfunction of thoracic region: Secondary | ICD-10-CM | POA: Diagnosis not present

## 2023-01-25 DIAGNOSIS — M9901 Segmental and somatic dysfunction of cervical region: Secondary | ICD-10-CM | POA: Diagnosis not present

## 2023-01-25 DIAGNOSIS — M9903 Segmental and somatic dysfunction of lumbar region: Secondary | ICD-10-CM | POA: Diagnosis not present

## 2023-01-26 DIAGNOSIS — M9903 Segmental and somatic dysfunction of lumbar region: Secondary | ICD-10-CM | POA: Diagnosis not present

## 2023-01-26 DIAGNOSIS — M9906 Segmental and somatic dysfunction of lower extremity: Secondary | ICD-10-CM | POA: Diagnosis not present

## 2023-01-26 DIAGNOSIS — M9901 Segmental and somatic dysfunction of cervical region: Secondary | ICD-10-CM | POA: Diagnosis not present

## 2023-01-26 DIAGNOSIS — M9902 Segmental and somatic dysfunction of thoracic region: Secondary | ICD-10-CM | POA: Diagnosis not present

## 2023-01-31 DIAGNOSIS — M9901 Segmental and somatic dysfunction of cervical region: Secondary | ICD-10-CM | POA: Diagnosis not present

## 2023-01-31 DIAGNOSIS — M9903 Segmental and somatic dysfunction of lumbar region: Secondary | ICD-10-CM | POA: Diagnosis not present

## 2023-01-31 DIAGNOSIS — M9902 Segmental and somatic dysfunction of thoracic region: Secondary | ICD-10-CM | POA: Diagnosis not present

## 2023-01-31 DIAGNOSIS — M9906 Segmental and somatic dysfunction of lower extremity: Secondary | ICD-10-CM | POA: Diagnosis not present

## 2023-02-01 DIAGNOSIS — M9903 Segmental and somatic dysfunction of lumbar region: Secondary | ICD-10-CM | POA: Diagnosis not present

## 2023-02-01 DIAGNOSIS — M9906 Segmental and somatic dysfunction of lower extremity: Secondary | ICD-10-CM | POA: Diagnosis not present

## 2023-02-01 DIAGNOSIS — M9902 Segmental and somatic dysfunction of thoracic region: Secondary | ICD-10-CM | POA: Diagnosis not present

## 2023-02-01 DIAGNOSIS — M9901 Segmental and somatic dysfunction of cervical region: Secondary | ICD-10-CM | POA: Diagnosis not present

## 2023-02-02 DIAGNOSIS — M9901 Segmental and somatic dysfunction of cervical region: Secondary | ICD-10-CM | POA: Diagnosis not present

## 2023-02-02 DIAGNOSIS — M9903 Segmental and somatic dysfunction of lumbar region: Secondary | ICD-10-CM | POA: Diagnosis not present

## 2023-02-02 DIAGNOSIS — M9902 Segmental and somatic dysfunction of thoracic region: Secondary | ICD-10-CM | POA: Diagnosis not present

## 2023-02-02 DIAGNOSIS — M9906 Segmental and somatic dysfunction of lower extremity: Secondary | ICD-10-CM | POA: Diagnosis not present

## 2023-02-07 DIAGNOSIS — M9906 Segmental and somatic dysfunction of lower extremity: Secondary | ICD-10-CM | POA: Diagnosis not present

## 2023-02-07 DIAGNOSIS — M9903 Segmental and somatic dysfunction of lumbar region: Secondary | ICD-10-CM | POA: Diagnosis not present

## 2023-02-07 DIAGNOSIS — M9902 Segmental and somatic dysfunction of thoracic region: Secondary | ICD-10-CM | POA: Diagnosis not present

## 2023-02-07 DIAGNOSIS — M9901 Segmental and somatic dysfunction of cervical region: Secondary | ICD-10-CM | POA: Diagnosis not present

## 2023-02-09 DIAGNOSIS — M9903 Segmental and somatic dysfunction of lumbar region: Secondary | ICD-10-CM | POA: Diagnosis not present

## 2023-02-09 DIAGNOSIS — M9901 Segmental and somatic dysfunction of cervical region: Secondary | ICD-10-CM | POA: Diagnosis not present

## 2023-02-09 DIAGNOSIS — M9906 Segmental and somatic dysfunction of lower extremity: Secondary | ICD-10-CM | POA: Diagnosis not present

## 2023-02-09 DIAGNOSIS — M9902 Segmental and somatic dysfunction of thoracic region: Secondary | ICD-10-CM | POA: Diagnosis not present

## 2023-02-14 DIAGNOSIS — M9903 Segmental and somatic dysfunction of lumbar region: Secondary | ICD-10-CM | POA: Diagnosis not present

## 2023-02-14 DIAGNOSIS — M9906 Segmental and somatic dysfunction of lower extremity: Secondary | ICD-10-CM | POA: Diagnosis not present

## 2023-02-14 DIAGNOSIS — M9901 Segmental and somatic dysfunction of cervical region: Secondary | ICD-10-CM | POA: Diagnosis not present

## 2023-02-14 DIAGNOSIS — M9902 Segmental and somatic dysfunction of thoracic region: Secondary | ICD-10-CM | POA: Diagnosis not present

## 2023-02-21 DIAGNOSIS — M9902 Segmental and somatic dysfunction of thoracic region: Secondary | ICD-10-CM | POA: Diagnosis not present

## 2023-02-21 DIAGNOSIS — M9901 Segmental and somatic dysfunction of cervical region: Secondary | ICD-10-CM | POA: Diagnosis not present

## 2023-02-21 DIAGNOSIS — M9906 Segmental and somatic dysfunction of lower extremity: Secondary | ICD-10-CM | POA: Diagnosis not present

## 2023-02-21 DIAGNOSIS — M9903 Segmental and somatic dysfunction of lumbar region: Secondary | ICD-10-CM | POA: Diagnosis not present

## 2024-07-10 DIAGNOSIS — Z131 Encounter for screening for diabetes mellitus: Secondary | ICD-10-CM | POA: Diagnosis not present

## 2024-07-10 DIAGNOSIS — Z13220 Encounter for screening for lipoid disorders: Secondary | ICD-10-CM | POA: Diagnosis not present

## 2024-07-10 DIAGNOSIS — Z0000 Encounter for general adult medical examination without abnormal findings: Secondary | ICD-10-CM | POA: Diagnosis not present
